# Patient Record
Sex: Male | Born: 1985 | Race: White | Hispanic: No | Marital: Single | Smoking: Current every day smoker
Health system: Southern US, Community
[De-identification: ages and names within clinical notes are randomized; demographics above are authoritative.]

## PROBLEM LIST (undated history)

## (undated) HISTORY — PX: TONSILLECTOMY: SUR1361

---

## 2012-12-28 ENCOUNTER — Encounter (HOSPITAL_COMMUNITY): Payer: Self-pay | Admitting: *Deleted

## 2012-12-28 ENCOUNTER — Emergency Department (HOSPITAL_COMMUNITY)
Admission: EM | Admit: 2012-12-28 | Discharge: 2012-12-28 | Disposition: A | Discharge status: Home / Self Care | Payer: Self-pay | Attending: Emergency Medicine | Admitting: Emergency Medicine

## 2012-12-28 DIAGNOSIS — R509 Fever, unspecified: Secondary | ICD-10-CM | POA: Insufficient documentation

## 2012-12-28 DIAGNOSIS — R51 Headache: Secondary | ICD-10-CM | POA: Insufficient documentation

## 2012-12-28 DIAGNOSIS — H6691 Otitis media, unspecified, right ear: Secondary | ICD-10-CM

## 2012-12-28 DIAGNOSIS — F17200 Nicotine dependence, unspecified, uncomplicated: Secondary | ICD-10-CM | POA: Insufficient documentation

## 2012-12-28 DIAGNOSIS — H6690 Otitis media, unspecified, unspecified ear: Secondary | ICD-10-CM | POA: Insufficient documentation

## 2012-12-28 DIAGNOSIS — J3489 Other specified disorders of nose and nasal sinuses: Secondary | ICD-10-CM | POA: Insufficient documentation

## 2012-12-28 MED ORDER — ANTIPYRINE-BENZOCAINE 5.4-1.4 % OT SOLN
3.0000 [drp] | Freq: Once | OTIC | Status: AC
  Administered 2012-12-28: 4 [drp] via OTIC
  Filled 2012-12-28: qty 10

## 2012-12-28 MED ORDER — IBUPROFEN 600 MG PO TABS: 600.0000 mg | ORAL_TABLET | Freq: Four times a day (QID) | ORAL | Status: DC | PRN

## 2012-12-28 MED ORDER — AMOXICILLIN 500 MG PO CAPS: 500.0000 mg | ORAL_CAPSULE | Freq: Three times a day (TID) | ORAL | Status: AC

## 2012-12-28 NOTE — ED Notes (Signed)
Pt states taht he has been having problems with his sinus all week, right earache started this am ,

## 2012-12-28 NOTE — ED Notes (Signed)
No answer in waiting room 

## 2012-12-30 NOTE — ED Provider Notes (Signed)
History    CSN: [id number] Arrival date & time 12/28/12  1539  First MD Initiated Contact with Patient 12/28/12 1648     Chief Complaint  Patient presents with  . Otalgia  . Facial Pain   (Consider location/radiation/quality/duration/timing/severity/associated sxs/prior Treatment) Patient is a 27 y.o. male presenting with ear pain. The history is provided by the patient.  Otalgia Location:  Right Quality:  Aching and pressure Severity:  Moderate Onset quality:  Gradual Duration:  1 day Timing:  Constant Progression:  Worsening Chronicity:  New Relieved by:  Nothing Worsened by:  Nothing tried Ineffective treatments:  OTC medications (tylenol and nyquill sinus) Associated symptoms: congestion, fever, headaches and rhinorrhea   Associated symptoms: no abdominal pain, no cough, no ear discharge, no neck pain, no rash, no sore throat and no tinnitus   Associated symptoms comment:  He reports sinus pain and congestion.  History reviewed. No pertinent past medical history. History reviewed. No pertinent past surgical history. No family history on file. History  Substance Use Topics  . Smoking status: Current Every Day Smoker  . Smokeless tobacco: Not on file  . Alcohol Use: No    Review of Systems  Constitutional: Positive for fever and chills.  HENT: Positive for ear pain, congestion and rhinorrhea. Negative for sore throat, neck pain, tinnitus and ear discharge.   Eyes: Negative.   Respiratory: Negative for cough, chest tightness and shortness of breath.   Cardiovascular: Negative for chest pain.  Gastrointestinal: Negative for nausea and abdominal pain.  Genitourinary: Negative.   Musculoskeletal: Negative for joint swelling and arthralgias.  Skin: Negative.  Negative for rash and wound.  Neurological: Positive for headaches. Negative for dizziness, weakness, light-headedness and numbness.  Psychiatric/Behavioral: Negative.     Allergies  Review of patient's  allergies indicates no known allergies.  Home Medications   Current Outpatient Rx  Name  Route  Sig  Dispense  Refill  . acetaminophen (TYLENOL) 325 MG tablet   Oral   Take 650 mg by mouth every 6 (six) hours as needed for pain.         . diphenhydrAMINE (BENADRYL ALLERGY) 25 mg capsule   Oral   Take 25 mg by mouth every 6 (six) hours as needed for itching or allergies.         . Doxylamine-Phenylephrine-APAP (VICKS NYQUIL SINUS PO)   Oral   Take 1 tablet by mouth daily as needed (congestion).         Marland Kitchen amoxicillin (AMOXIL) 500 MG capsule   Oral   Take 1 capsule (500 mg total) by mouth 3 (three) times daily.   30 capsule   0   . ibuprofen (ADVIL,MOTRIN) 600 MG tablet   Oral   Take 1 tablet (600 mg total) by mouth every 6 (six) hours as needed for pain.   30 tablet   0    BP 157/109  Pulse 97  Temp(Src) 100.2 F (37.9 C) (Oral)  Resp 18  Ht 5\' 9"  (1.753 m)  Wt 150 lb (68.04 kg)  BMI 22.14 kg/m2  SpO2 99% Physical Exam  Constitutional: He is oriented to person, place, and time. He appears well-developed and well-nourished.  HENT:  Head: Normocephalic and atraumatic.  Right Ear: Ear canal normal. There is swelling. Tympanic membrane is erythematous and bulging.  Left Ear: Tympanic membrane and ear canal normal.  Nose: Mucosal edema and rhinorrhea present.  Mouth/Throat: Uvula is midline, oropharynx is clear and moist and mucous membranes are normal. No  oropharyngeal exudate, posterior oropharyngeal edema, posterior oropharyngeal erythema or tonsillar abscesses.  Eyes: Conjunctivae are normal.  Cardiovascular: Normal rate and normal heart sounds.   Pulmonary/Chest: Effort normal. No respiratory distress. He has no wheezes. He has no rales.  Abdominal: Soft. There is no tenderness.  Musculoskeletal: Normal range of motion.  Neurological: He is alert and oriented to person, place, and time.  Skin: Skin is warm and dry. No rash noted.  Psychiatric: He has a  normal mood and affect.    ED Course  Procedures (including critical care time) Labs Reviewed - No data to display No results found. 1. Otitis media, right     MDM  Right otitis.  Pt encouraged to continue otc decongestant.  Prescribed ibuprofen,  Amoxil.  He was also given auralagan for home use for ear pain, first dose given prior to dc home.  Plan recheck if sx are not improved over the next several days.  Burgess Amor, PA-C 12/30/12 1423

## 2012-12-31 NOTE — ED Provider Notes (Signed)
Medical screening examination/treatment/procedure(s) were performed by non-physician practitioner and as supervising physician I was immediately available for consultation/collaboration.  Donnetta Hutching, MD 12/31/12 [phone]

## 2015-01-14 ENCOUNTER — Emergency Department (HOSPITAL_COMMUNITY): Payer: Self-pay

## 2015-01-14 ENCOUNTER — Emergency Department (HOSPITAL_COMMUNITY)
Admission: EM | Admit: 2015-01-14 | Discharge: 2015-01-14 | Disposition: A | Discharge status: Home / Self Care | Payer: Self-pay | Attending: Emergency Medicine | Admitting: Emergency Medicine

## 2015-01-14 ENCOUNTER — Encounter (HOSPITAL_COMMUNITY): Payer: Self-pay | Admitting: *Deleted

## 2015-01-14 DIAGNOSIS — Y9339 Activity, other involving climbing, rappelling and jumping off: Secondary | ICD-10-CM | POA: Insufficient documentation

## 2015-01-14 DIAGNOSIS — X58XXXA Exposure to other specified factors, initial encounter: Secondary | ICD-10-CM | POA: Insufficient documentation

## 2015-01-14 DIAGNOSIS — S93601A Unspecified sprain of right foot, initial encounter: Secondary | ICD-10-CM | POA: Insufficient documentation

## 2015-01-14 DIAGNOSIS — Y9289 Other specified places as the place of occurrence of the external cause: Secondary | ICD-10-CM | POA: Insufficient documentation

## 2015-01-14 DIAGNOSIS — Z720 Tobacco use: Secondary | ICD-10-CM | POA: Insufficient documentation

## 2015-01-14 DIAGNOSIS — Y998 Other external cause status: Secondary | ICD-10-CM | POA: Insufficient documentation

## 2015-01-14 MED ORDER — IBUPROFEN 800 MG PO TABS: 800.0000 mg | ORAL_TABLET | Freq: Three times a day (TID) | ORAL | Status: AC

## 2015-01-14 MED ORDER — IBUPROFEN 800 MG PO TABS
800.0000 mg | ORAL_TABLET | Freq: Once | ORAL | Status: AC
  Administered 2015-01-14: 800 mg via ORAL
  Filled 2015-01-14: qty 1

## 2015-01-14 NOTE — Discharge Instructions (Signed)
Foot Sprain The muscles and cord like structures which attach muscle to bone (tendons) that surround the feet are made up of units. A foot sprain can occur at the weakest spot in any of these units. This condition is most often caused by injury to or overuse of the foot, as from playing contact sports, or aggravating a previous injury, or from poor conditioning, or obesity. SYMPTOMS  Pain with movement of the foot.  Tenderness and swelling at the injury site.  Loss of strength is present in moderate or severe sprains. THE THREE GRADES OR SEVERITY OF FOOT SPRAIN ARE:  Mild (Grade I): Slightly pulled muscle without tearing of muscle or tendon fibers or loss of strength.  Moderate (Grade II): Tearing of fibers in a muscle, tendon, or at the attachment to bone, with small decrease in strength.  Severe (Grade III): Rupture of the muscle-tendon-bone attachment, with separation of fibers. Severe sprain requires surgical repair. Often repeating (chronic) sprains are caused by overuse. Sudden (acute) sprains are caused by direct injury or over-use. DIAGNOSIS  Diagnosis of this condition is usually by your own observation. If problems continue, a caregiver may be required for further evaluation and treatment. X-rays may be required to make sure there are not breaks in the bones (fractures) present. Continued problems may require physical therapy for treatment. PREVENTION  Use strength and conditioning exercises appropriate for your sport.  Warm up properly prior to working out.  Use athletic shoes that are made for the sport you are participating in.  Allow adequate time for healing. Early return to activities makes repeat injury more likely, and can lead to an unstable arthritic foot that can result in prolonged disability. Mild sprains generally heal in 3 to 10 days, with moderate and severe sprains taking 2 to 10 weeks. Your caregiver can help you determine the proper time required for  healing. HOME CARE INSTRUCTIONS   Apply ice to the injury for 15-20 minutes, 03-04 times per day. Put the ice in a plastic bag and place a towel between the bag of ice and your skin.  An elastic wrap (like an Ace bandage) may be used to keep swelling down.  Keep foot above the level of the heart, or at least raised on a footstool, when swelling and pain are present.  Try to avoid use other than gentle range of motion while the foot is painful. Do not resume use until instructed by your caregiver. Then begin use gradually, not increasing use to the point of pain. If pain does develop, decrease use and continue the above measures, gradually increasing activities that do not cause discomfort, until you gradually achieve normal use.  Use crutches if and as instructed, and for the length of time instructed.  Keep injured foot and ankle wrapped between treatments.  Massage foot and ankle for comfort and to keep swelling down. Massage from the toes up towards the knee.  Only take over-the-counter or prescription medicines for pain, discomfort, or fever as directed by your caregiver. SEEK IMMEDIATE MEDICAL CARE IF:   Your pain and swelling increase, or pain is not controlled with medications.  You have loss of feeling in your foot or your foot turns cold or blue.  You develop new, unexplained symptoms, or an increase of the symptoms that brought you to your caregiver. MAKE SURE YOU:   Understand these instructions.  Will watch your condition.  Will get help right away if you are not doing well or get worse. Document Released:   11/24/2001 Document Revised: 08/27/2011 Document Reviewed: 01/22/2008 [employer] Patient Information 2015 [employer]. This information is not intended to replace advice given to you by your health care provider. Make sure you discuss any questions you have with your health care provider.  

## 2015-01-14 NOTE — ED Notes (Signed)
Pt. returned from XR. 

## 2015-01-14 NOTE — ED Notes (Signed)
Pt taken to xr  

## 2015-01-14 NOTE — ED Notes (Signed)
Pt made aware to return if symptoms worsen or if any life threatening symptoms occur.   

## 2015-01-14 NOTE — ED Notes (Signed)
Pt c/o pain to right outer pain after jumping off a porch and landing wrong last night about 2000. Pt reports he was able to limp on it last night but woke up in the middle of the night with pain and has been unable to bear any weight on it since then. Pulse palpable in right foot, slight swelling noted.

## 2015-01-16 NOTE — ED Provider Notes (Signed)
CSN: [id number]     Arrival date & time 01/14/15  9629 History   First MD Initiated Contact with Patient 01/14/15 1016     Chief Complaint  Patient presents with  . Foot Pain     (Consider location/radiation/quality/duration/timing/severity/associated sxs/prior Treatment) The history is provided by the patient.   Colton Garner is a 29 y.o. male presenting with right lateral foot pain since he jumped off a porch last night, landing on the outer edge of his foot onto grass.  He was able to weight bear after the event, but woke today with worsened pain with attempts at weight bearing and is concerned for possible fracture.  There is no radiation of pain and he denies any other injury. He has taken tylenol without relief of pain.    History reviewed. No pertinent past medical history. Past Surgical History  Procedure Laterality Date  . Tonsillectomy     No family history on file. History  Substance Use Topics  . Smoking status: Current Every Day Smoker -- 1.00 packs/day for 8 years    Types: Cigarettes  . Smokeless tobacco: Not on file  . Alcohol Use: Yes     Comment: 12-24 cans of beer per week     Review of Systems  Constitutional: Negative for fever.  Musculoskeletal: Positive for joint swelling and arthralgias. Negative for myalgias.  Neurological: Negative for weakness and numbness.      Allergies  Review of patient's allergies indicates no known allergies.  Home Medications   Prior to Admission medications   Medication Sig Start Date End Date Taking? Authorizing Provider  acetaminophen (TYLENOL) 325 MG tablet Take 650 mg by mouth every 6 (six) hours as needed for pain.    Historical Provider, MD  diphenhydrAMINE (BENADRYL ALLERGY) 25 mg capsule Take 25 mg by mouth every 6 (six) hours as needed for itching or allergies.    Historical Provider, MD  ibuprofen (ADVIL,MOTRIN) 800 MG tablet Take 1 tablet (800 mg total) by mouth 3 (three) times daily. 01/14/15   Burgess Amor,  PA-C   BP 177/101 mmHg  Pulse 78  Temp(Src) 98 F (36.7 C) (Oral)  Resp 18  Ht  (1.753 m)  Wt 150 lb (68.04 kg)  BMI 22.14 kg/m2  SpO2 97% Physical Exam  Constitutional: He appears well-developed and well-nourished.  HENT:  Head: Atraumatic.  Neck: Normal range of motion.  Cardiovascular:  Pulses equal bilaterally  Musculoskeletal:       Feet:  ttp with mild edema right 5th metacarpal. Distal sensation intact, no bruising, Pt can wiggle toes, less than 2 second cap refill in toes.  Ankle pain free.  Neurological: He is alert. He has normal strength. He displays normal reflexes. No sensory deficit.  Skin: Skin is warm and dry.  Psychiatric: He has a normal mood and affect.    ED Course  Procedures (including critical care time) Labs Review Labs Reviewed - No data to display  Imaging Review No results found.   EKG Interpretation None      MDM   Final diagnoses:  Foot sprain, right, initial encounter    Patients labs and/or radiological studies were reviewed and considered during the medical decision making and disposition process.   Imaging was reviewed, interpreted and I agree with radiologists reading.  Results were also discussed with patient.  RICE, aso, crutches, ibuprofen. Prn f/u if sx not improving over the next week. Ortho referral given.  The patient appears reasonably screened and/or stabilized for discharge  and I doubt any other medical condition or other [facility] requiring further screening, evaluation, or treatment in the ED at this time prior to discharge.     Burgess Amor, PA-C 01/16/15 2337  Samuel Jester, DO 01/17/15 1233

## 2016-08-22 IMAGING — DX DG FOOT COMPLETE 3+V*R*
3 series · 3 of 3 positions shown · non-contrast
Comparison: None.

CLINICAL DATA: Right foot injury. Pain at base of fifth metatarsal.

EXAM:
RIGHT FOOT COMPLETE - 3+ VIEW

[foot ap]
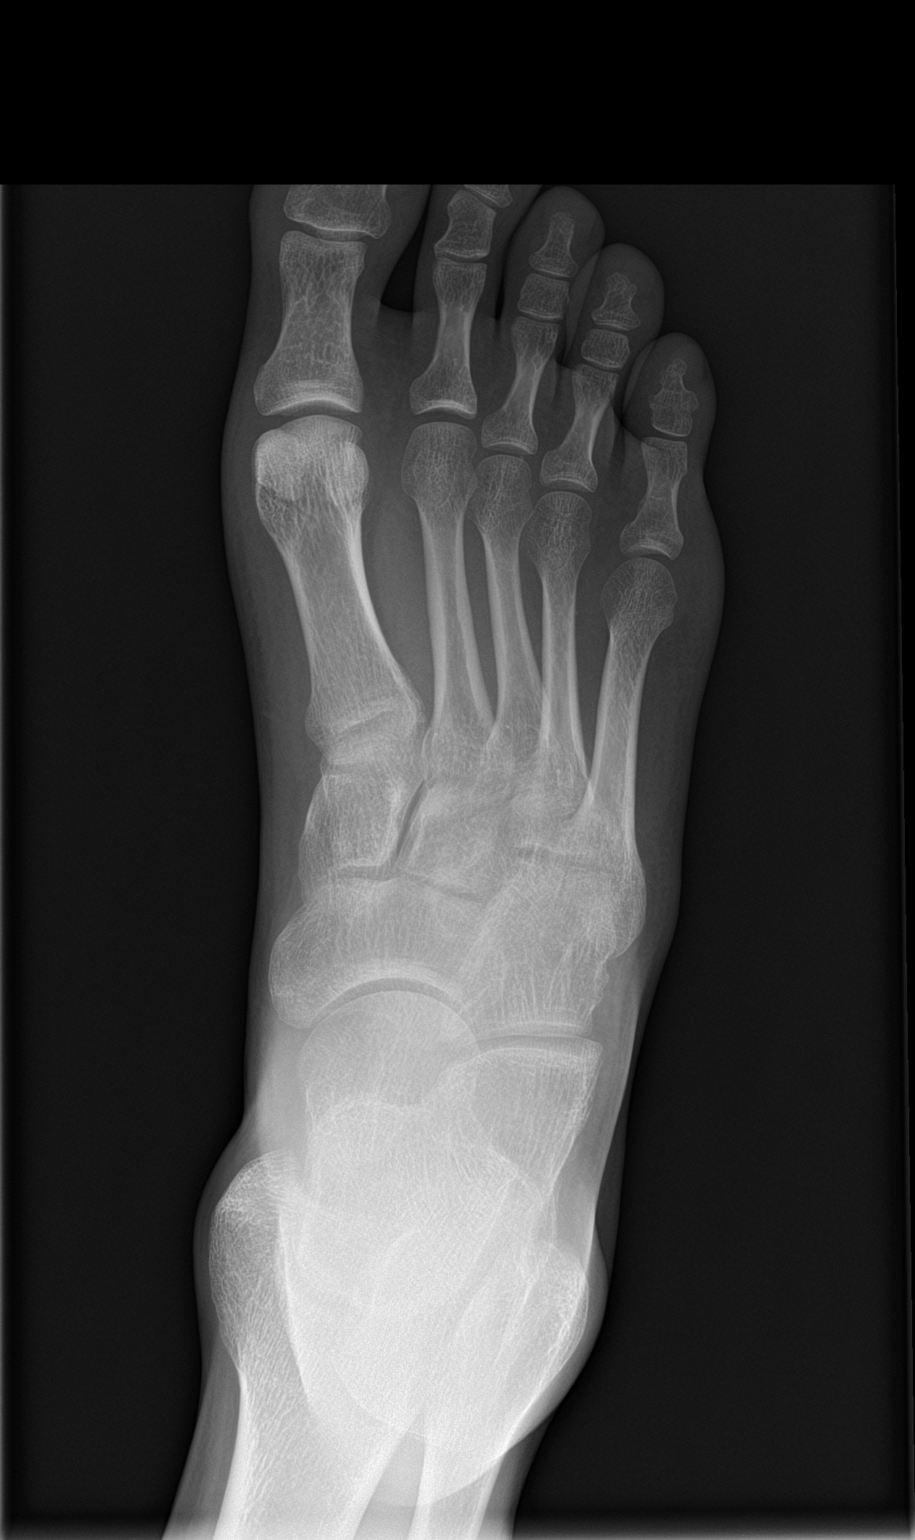

[foot obl]
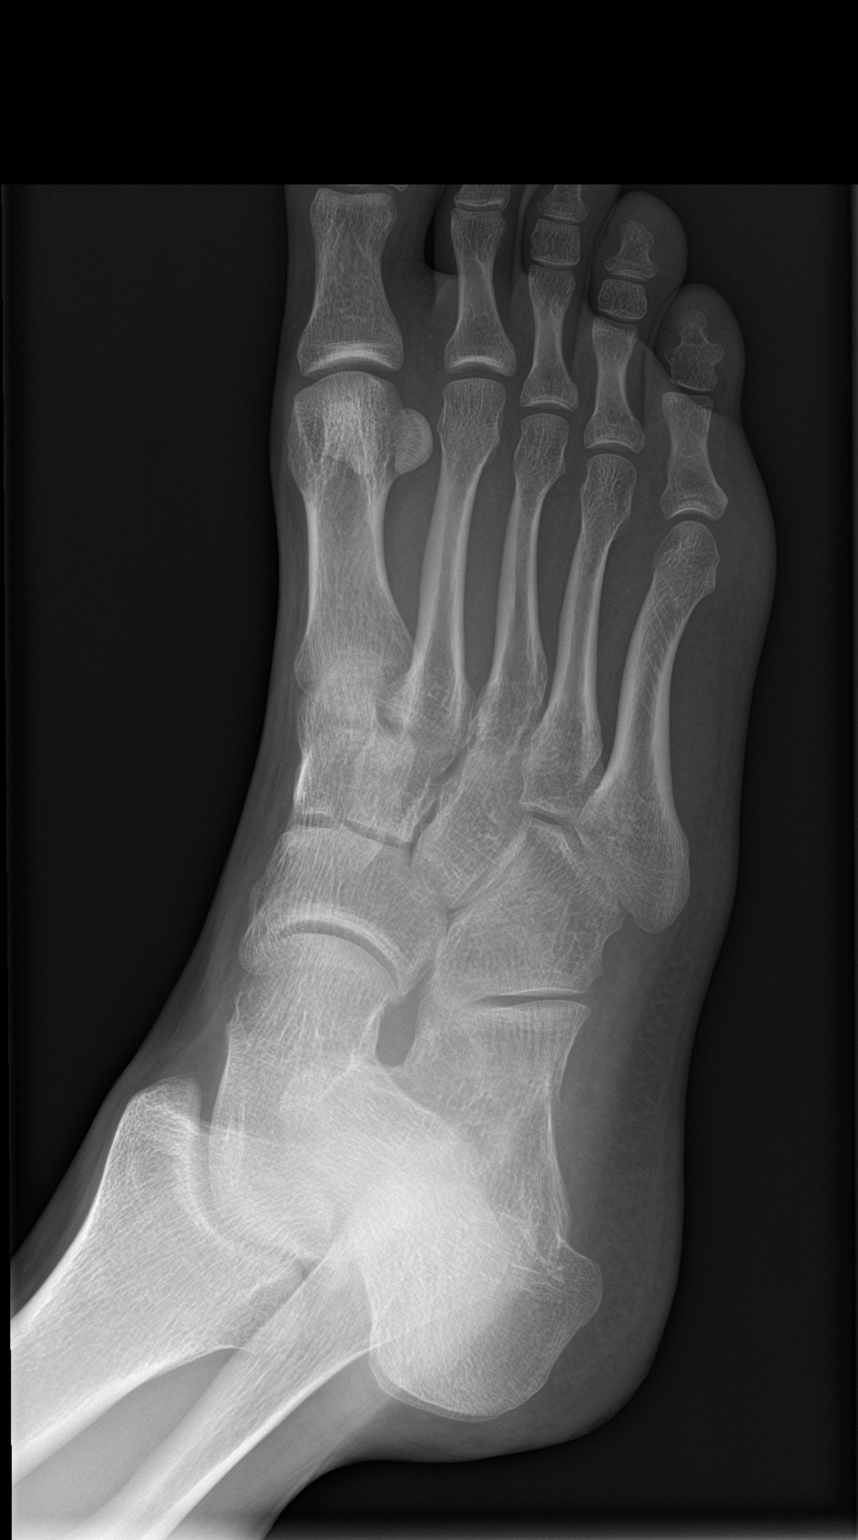

[foot lat]
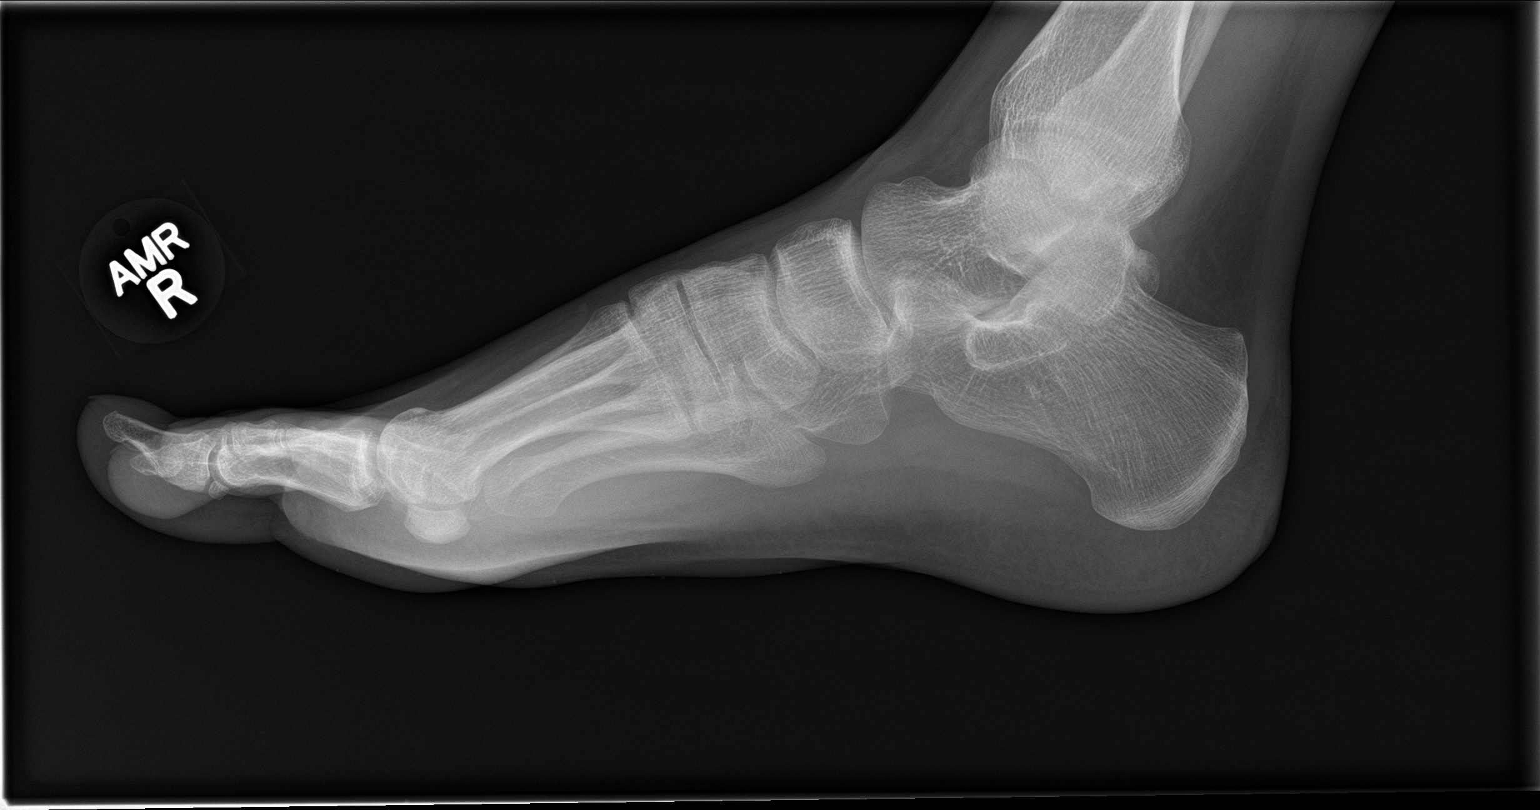

[3 of 3 positions shown; findings below may reference images not displayed]

FINDINGS: There is no evidence of fracture or dislocation. There is no
evidence of arthropathy or other focal bone abnormality. Soft
tissues are unremarkable.
IMPRESSION: Normal plain film examination of the right foot. No fracture or
dislocation.
